# Patient Record
Sex: Male | Born: 1935 | Race: White | Hispanic: No | Marital: Married
Health system: Southern US, Community
[De-identification: ages and names within clinical notes are randomized; demographics above are authoritative.]

---

## 2003-03-13 ENCOUNTER — Other Ambulatory Visit: Payer: Self-pay

## 2004-03-20 ENCOUNTER — Other Ambulatory Visit: Payer: Self-pay

## 2004-03-20 ENCOUNTER — Observation Stay: Payer: Self-pay | Admitting: Cardiology

## 2004-03-25 ENCOUNTER — Ambulatory Visit: Payer: Self-pay | Admitting: *Deleted

## 2005-11-05 ENCOUNTER — Ambulatory Visit: Payer: Self-pay | Admitting: Gastroenterology

## 2009-10-03 ENCOUNTER — Ambulatory Visit: Payer: Self-pay | Admitting: Family Medicine

## 2010-08-08 ENCOUNTER — Ambulatory Visit: Payer: Self-pay | Admitting: Family Medicine

## 2010-09-03 ENCOUNTER — Ambulatory Visit: Payer: Self-pay | Admitting: Gastroenterology

## 2010-09-05 LAB — PATHOLOGY REPORT

## 2012-02-21 ENCOUNTER — Ambulatory Visit: Payer: Self-pay | Admitting: Hospice and Palliative Medicine

## 2012-02-28 ENCOUNTER — Inpatient Hospital Stay: Payer: Self-pay | Admitting: Student

## 2012-02-28 LAB — CBC
HCT: 32.2 % — ABNORMAL LOW (ref 40.0–52.0)
MCHC: 33.4 g/dL (ref 32.0–36.0)
Platelet: 238 10*3/uL (ref 150–440)
RBC: 3.58 10*6/uL — ABNORMAL LOW (ref 4.40–5.90)
RDW: 14 % (ref 11.5–14.5)
WBC: 15.7 10*3/uL — ABNORMAL HIGH (ref 3.8–10.6)

## 2012-02-28 LAB — COMPREHENSIVE METABOLIC PANEL
Alkaline Phosphatase: 94 U/L (ref 50–136)
BUN: 15 mg/dL (ref 7–18)
Calcium, Total: 8.1 mg/dL — ABNORMAL LOW (ref 8.5–10.1)
Co2: 29 mmol/L (ref 21–32)
Creatinine: 1.03 mg/dL (ref 0.60–1.30)
EGFR (Non-African Amer.): >60
SGPT (ALT): 18 U/L (ref 12–78)
Sodium: 140 mmol/L (ref 136–145)
Total Protein: 6.1 g/dL — ABNORMAL LOW (ref 6.4–8.2)

## 2012-02-28 LAB — CK TOTAL AND CKMB (NOT AT ARMC): CK-MB: 3.8 ng/mL — ABNORMAL HIGH (ref 0.5–3.6)

## 2012-02-28 LAB — URINALYSIS, COMPLETE
Ketone: NEGATIVE
Nitrite: NEGATIVE
Ph: 5 (ref 4.5–8.0)
Protein: NEGATIVE
RBC,UR: 2 /HPF (ref 0–5)

## 2012-02-28 LAB — TROPONIN I: Troponin-I: 0.1 ng/mL — ABNORMAL HIGH

## 2012-02-29 LAB — BASIC METABOLIC PANEL
Anion Gap: 7 (ref 7–16)
BUN: 13 mg/dL (ref 7–18)
Calcium, Total: 8.2 mg/dL — ABNORMAL LOW (ref 8.5–10.1)
Chloride: 106 mmol/L (ref 98–107)
Co2: 24 mmol/L (ref 21–32)
EGFR (Non-African Amer.): >60
Glucose: 101 mg/dL — ABNORMAL HIGH (ref 65–99)
Osmolality: 274 (ref 275–301)
Potassium: 3.5 mmol/L (ref 3.5–5.1)

## 2012-02-29 LAB — CBC WITH DIFFERENTIAL/PLATELET
Basophil #: 0.1 10*3/uL (ref 0.0–0.1)
Eosinophil %: 0.2 %
Lymphocyte #: 0.9 10*3/uL — ABNORMAL LOW (ref 1.0–3.6)
MCH: 31.5 pg (ref 26.0–34.0)
MCV: 90 fL (ref 80–100)
Monocyte #: 0.6 x10 3/mm (ref 0.2–1.0)
Monocyte %: 5.1 %
Neutrophil #: 11.1 10*3/uL — ABNORMAL HIGH (ref 1.4–6.5)
Neutrophil %: 87 %
Platelet: 218 10*3/uL (ref 150–440)
RDW: 14.1 % (ref 11.5–14.5)
WBC: 12.7 10*3/uL — ABNORMAL HIGH (ref 3.8–10.6)

## 2012-02-29 LAB — CK TOTAL AND CKMB (NOT AT ARMC): CK-MB: 3.8 ng/mL — ABNORMAL HIGH (ref 0.5–3.6)

## 2012-03-04 LAB — VANCOMYCIN, TROUGH: Vancomycin, Trough: 14 ug/mL (ref 10–20)

## 2012-03-05 LAB — CULTURE, BLOOD (SINGLE)

## 2012-03-07 LAB — CULTURE, BLOOD (SINGLE)

## 2012-03-10 ENCOUNTER — Inpatient Hospital Stay: Payer: Self-pay | Admitting: Internal Medicine

## 2012-03-10 LAB — URINALYSIS, COMPLETE
Blood: NEGATIVE
Glucose,UR: NEGATIVE mg/dL (ref 0–75)
Nitrite: NEGATIVE
Protein: NEGATIVE
RBC,UR: <1 /HPF (ref 0–5)
Specific Gravity: 1.016 (ref 1.003–1.030)
WBC UR: 1 /HPF (ref 0–5)

## 2012-03-10 LAB — BASIC METABOLIC PANEL
BUN: 21 mg/dL — ABNORMAL HIGH (ref 7–18)
Calcium, Total: 7.8 mg/dL — ABNORMAL LOW (ref 8.5–10.1)
EGFR (African American): >60
EGFR (Non-African Amer.): 59 — ABNORMAL LOW
Glucose: 132 mg/dL — ABNORMAL HIGH (ref 65–99)
Potassium: 3.6 mmol/L (ref 3.5–5.1)
Sodium: 144 mmol/L (ref 136–145)

## 2012-03-10 LAB — TROPONIN I: Troponin-I: 0.4 ng/mL — ABNORMAL HIGH

## 2012-03-10 LAB — CBC
HGB: 9.4 g/dL — ABNORMAL LOW (ref 13.0–18.0)
MCH: 31.3 pg (ref 26.0–34.0)
RBC: 2.99 10*6/uL — ABNORMAL LOW (ref 4.40–5.90)
WBC: 9.3 10*3/uL (ref 3.8–10.6)

## 2012-03-11 LAB — TROPONIN I
Troponin-I: 0.34 ng/mL — ABNORMAL HIGH
Troponin-I: 0.23 ng/mL — ABNORMAL HIGH

## 2012-03-11 LAB — BASIC METABOLIC PANEL
BUN: 20 mg/dL — ABNORMAL HIGH (ref 7–18)
Chloride: 112 mmol/L — ABNORMAL HIGH (ref 98–107)
EGFR (African American): >60
Glucose: 128 mg/dL — ABNORMAL HIGH (ref 65–99)
Osmolality: 293 (ref 275–301)
Potassium: 3.7 mmol/L (ref 3.5–5.1)

## 2012-03-11 LAB — CBC WITH DIFFERENTIAL/PLATELET
Basophil #: 0 10*3/uL (ref 0.0–0.1)
Eosinophil #: 0 10*3/uL (ref 0.0–0.7)
Eosinophil %: 0.2 %
HCT: 27.9 % — ABNORMAL LOW (ref 40.0–52.0)
HGB: 9.6 g/dL — ABNORMAL LOW (ref 13.0–18.0)
Lymphocyte %: 10.3 %
MCH: 32 pg (ref 26.0–34.0)
Monocyte #: 0.4 x10 3/mm (ref 0.2–1.0)
Monocyte %: 4.5 %
Neutrophil #: 7.6 10*3/uL — ABNORMAL HIGH (ref 1.4–6.5)
Platelet: 151 10*3/uL (ref 150–440)
RBC: 3 10*6/uL — ABNORMAL LOW (ref 4.40–5.90)
RDW: 16.4 % — ABNORMAL HIGH (ref 11.5–14.5)
WBC: 9.1 10*3/uL (ref 3.8–10.6)

## 2012-03-11 LAB — MAGNESIUM: Magnesium: 1.8 mg/dL

## 2012-03-13 LAB — COMPREHENSIVE METABOLIC PANEL
Alkaline Phosphatase: 71 U/L (ref 50–136)
Anion Gap: 5 — ABNORMAL LOW (ref 7–16)
BUN: 23 mg/dL — ABNORMAL HIGH (ref 7–18)
Bilirubin,Total: 0.7 mg/dL (ref 0.2–1.0)
Chloride: 110 mmol/L — ABNORMAL HIGH (ref 98–107)
EGFR (African American): >60
Glucose: 120 mg/dL — ABNORMAL HIGH (ref 65–99)
Osmolality: 301 (ref 275–301)
Potassium: 3 mmol/L — ABNORMAL LOW (ref 3.5–5.1)
SGOT(AST): 24 U/L (ref 15–37)
Sodium: 149 mmol/L — ABNORMAL HIGH (ref 136–145)
Total Protein: 5.5 g/dL — ABNORMAL LOW (ref 6.4–8.2)

## 2012-03-13 LAB — CBC WITH DIFFERENTIAL/PLATELET
Basophil #: 0 10*3/uL (ref 0.0–0.1)
Basophil %: 0.2 %
Eosinophil %: 0.9 %
HCT: 27.2 % — ABNORMAL LOW (ref 40.0–52.0)
HGB: 9.1 g/dL — ABNORMAL LOW (ref 13.0–18.0)
MCH: 30.6 pg (ref 26.0–34.0)
MCV: 92 fL (ref 80–100)
Monocyte #: 0.3 x10 3/mm (ref 0.2–1.0)
Monocyte %: 4.3 %
Neutrophil #: 6.4 10*3/uL (ref 1.4–6.5)
RBC: 2.96 10*6/uL — ABNORMAL LOW (ref 4.40–5.90)
WBC: 7.5 10*3/uL (ref 3.8–10.6)

## 2012-03-14 LAB — BASIC METABOLIC PANEL
Anion Gap: 8 (ref 7–16)
BUN: 26 mg/dL — ABNORMAL HIGH (ref 7–18)
Chloride: 111 mmol/L — ABNORMAL HIGH (ref 98–107)
Co2: 28 mmol/L (ref 21–32)
EGFR (African American): >60
Osmolality: 299 (ref 275–301)

## 2012-03-14 LAB — EXPECTORATED SPUTUM ASSESSMENT W GRAM STAIN, RFLX TO RESP C

## 2012-03-17 LAB — CBC WITH DIFFERENTIAL/PLATELET
Basophil %: 0.6 %
Eosinophil #: 0.2 10*3/uL (ref 0.0–0.7)
HCT: 32.5 % — ABNORMAL LOW (ref 40.0–52.0)
HGB: 10.4 g/dL — ABNORMAL LOW (ref 13.0–18.0)
Lymphocyte #: 0.9 10*3/uL — ABNORMAL LOW (ref 1.0–3.6)
MCH: 30.1 pg (ref 26.0–34.0)
MCHC: 32 g/dL (ref 32.0–36.0)
MCV: 94 fL (ref 80–100)
Monocyte #: 0.6 x10 3/mm (ref 0.2–1.0)
Neutrophil #: 12.8 10*3/uL — ABNORMAL HIGH (ref 1.4–6.5)
Neutrophil %: 88.3 %

## 2012-03-17 LAB — BASIC METABOLIC PANEL
Anion Gap: 7 (ref 7–16)
BUN: 27 mg/dL — ABNORMAL HIGH (ref 7–18)
Calcium, Total: 8.2 mg/dL — ABNORMAL LOW (ref 8.5–10.1)
Chloride: 117 mmol/L — ABNORMAL HIGH (ref 98–107)
Creatinine: 1.08 mg/dL (ref 0.60–1.30)
EGFR (African American): >60
EGFR (Non-African Amer.): 60 — ABNORMAL LOW
Glucose: 107 mg/dL — ABNORMAL HIGH (ref 65–99)

## 2012-03-18 LAB — CBC WITH DIFFERENTIAL/PLATELET
Basophil #: 0 10*3/uL (ref 0.0–0.1)
Eosinophil %: 0.2 %
HGB: 10.8 g/dL — ABNORMAL LOW (ref 13.0–18.0)
Lymphocyte %: 5.3 %
Monocyte %: 2.6 %
Neutrophil #: 11.6 10*3/uL — ABNORMAL HIGH (ref 1.4–6.5)
Platelet: 202 10*3/uL (ref 150–440)
RBC: 3.51 10*6/uL — ABNORMAL LOW (ref 4.40–5.90)
RDW: 16.8 % — ABNORMAL HIGH (ref 11.5–14.5)

## 2012-03-18 LAB — BASIC METABOLIC PANEL
BUN: 32 mg/dL — ABNORMAL HIGH (ref 7–18)
Chloride: 121 mmol/L — ABNORMAL HIGH (ref 98–107)
Creatinine: 0.91 mg/dL (ref 0.60–1.30)
EGFR (Non-African Amer.): >60
Potassium: 3.8 mmol/L (ref 3.5–5.1)
Sodium: 156 mmol/L — ABNORMAL HIGH (ref 136–145)

## 2012-03-23 ENCOUNTER — Ambulatory Visit: Payer: Self-pay | Admitting: Hospice and Palliative Medicine

## 2012-04-23 DEATH — deceased

## 2014-04-09 IMAGING — CT CT CERVICAL SPINE WITHOUT CONTRAST
1 series · 12 of 14 positions shown, 15 images · non-contrast
Comparison: none

REASON FOR EXAM: UNWITNESSED FALL, DEMENTIA, UNABLE TO EVALUATED
CLINICALLY
COMMENTS:

PROCEDURE:     CT  - CT CERVICAL SPINE WO  - February 28, 2012 [DATE]
RESULT:     Comparison: None.
TECHNIQUE: Multiple axial CT images were obtained of the cervical spine,
without intravenous contrast.  Sagittal and coronal reformatted images were
constructed.

[Series 6: axial · axial · 0.45mm/px · z∈[+36,+156]mm · 12 of 72 slices shown, 15 images]
[im 6/72  soft-tissue]
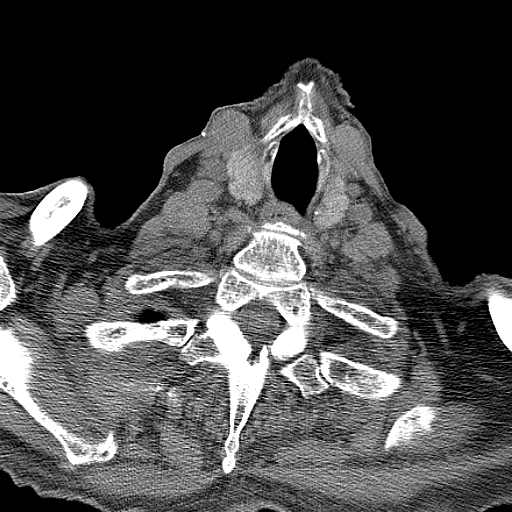
[im 6/72  bone]
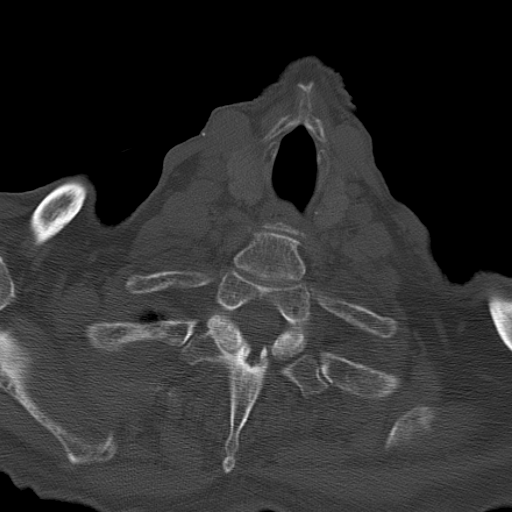
[im 11/72  bone]
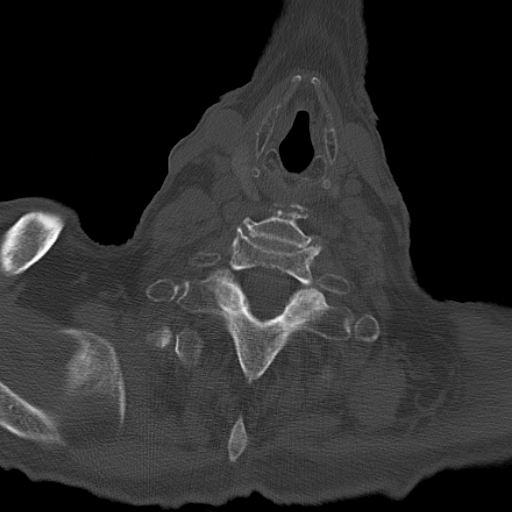
[im 17/72  bone]
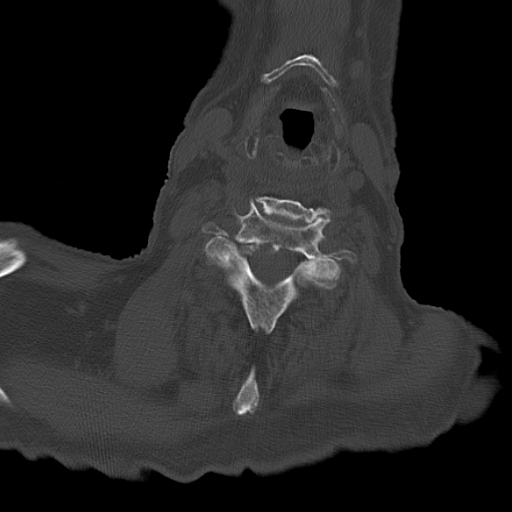
[im 22/72  bone]
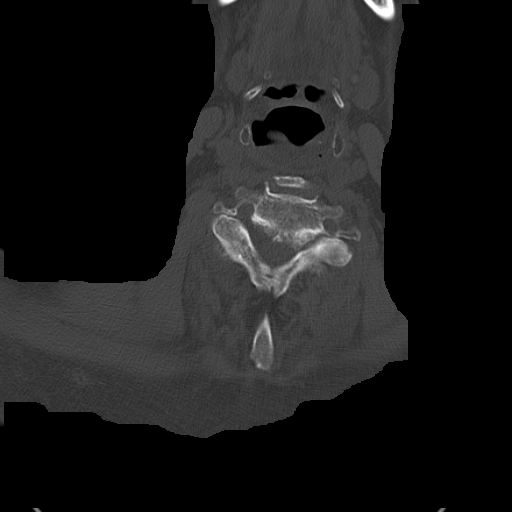
[im 28/72  soft-tissue]
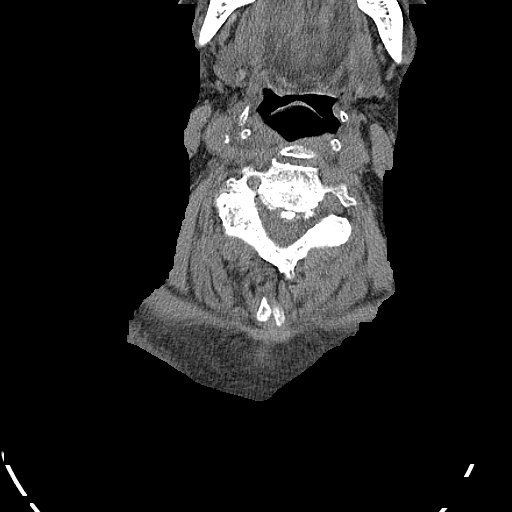
[im 28/72  bone]
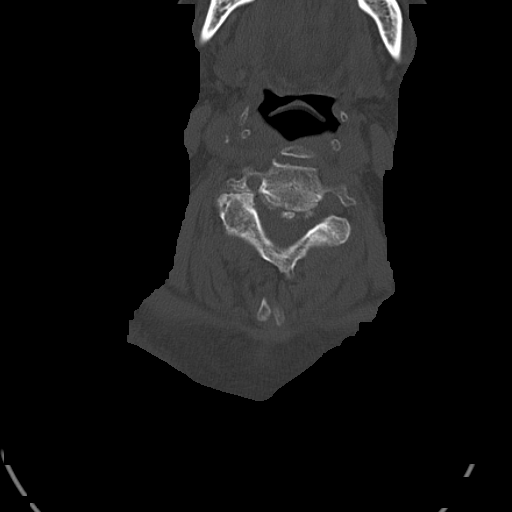
[im 33/72  bone]
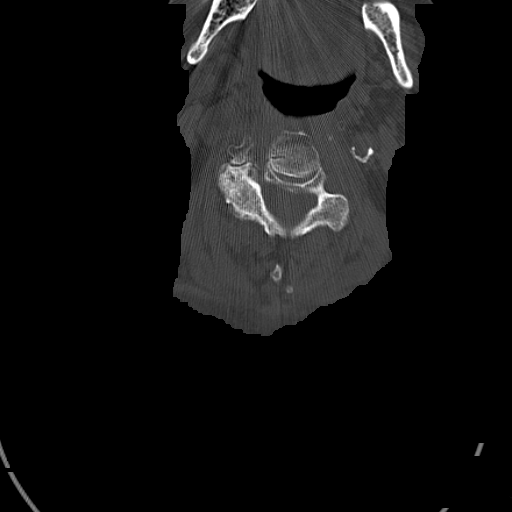
[im 39/72  bone]
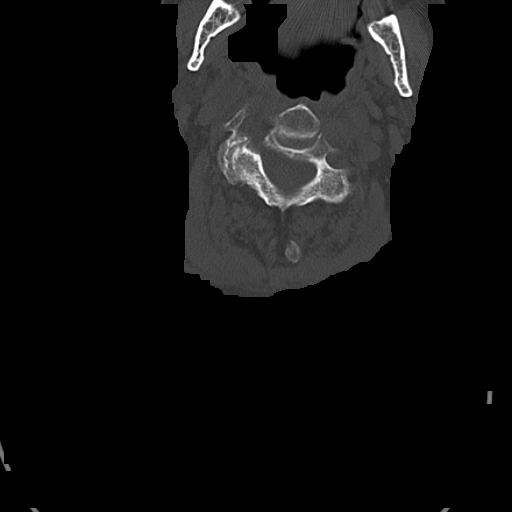
[im 44/72  bone]
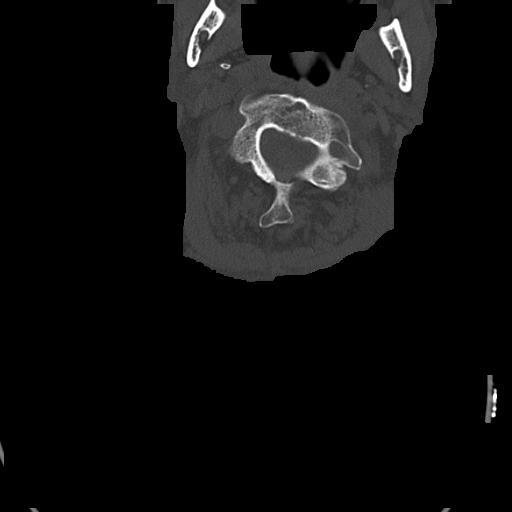
[im 50/72  soft-tissue]
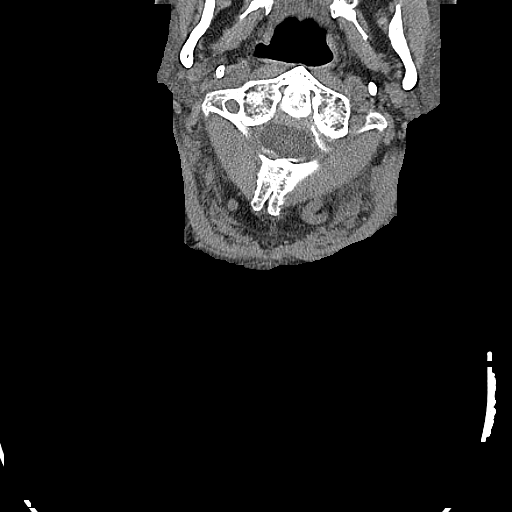
[im 50/72  bone]
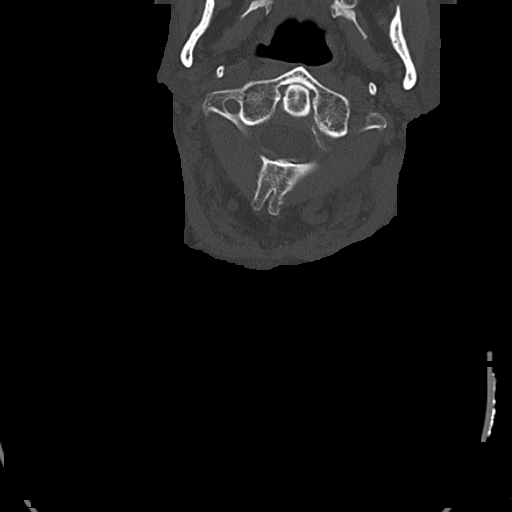
[im 55/72  bone]
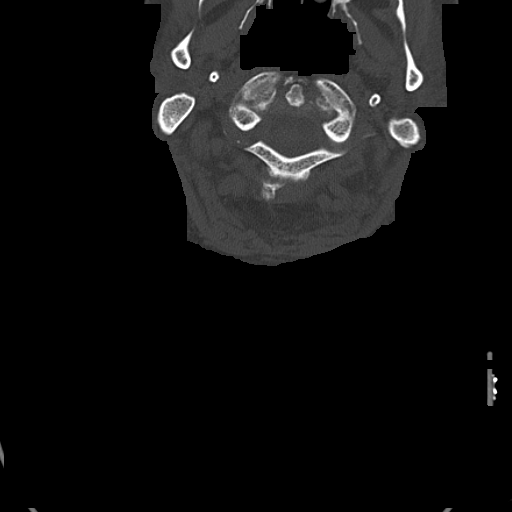
[im 61/72  bone]
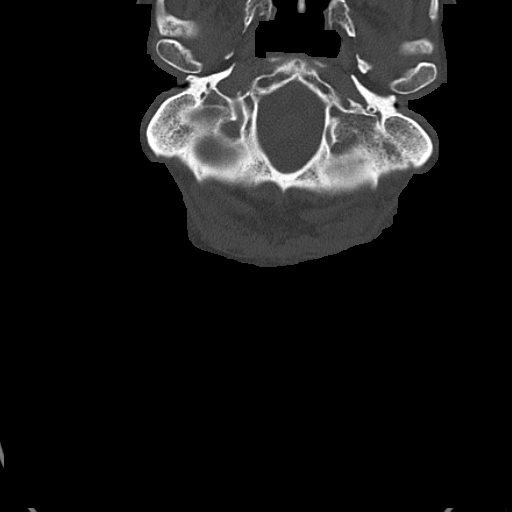
[im 66/72  bone]
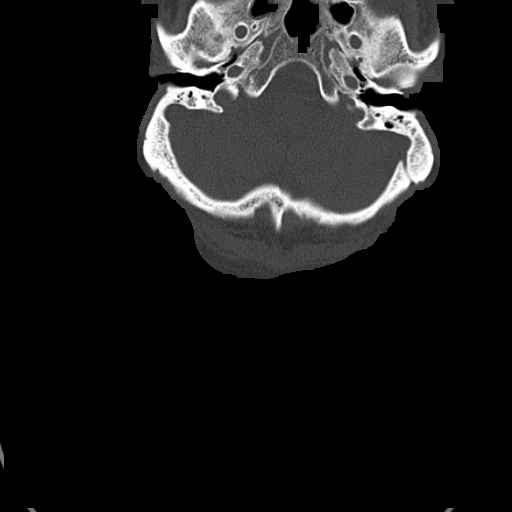

[12 of 14 positions shown; findings below may reference images not displayed]

FINDINGS: There is approximately 2-3 mm of anterolisthesis of C3 on C4 and C4 on C5.
There is 2 mm of retrolisthesis of C5 on C6 and C6 on C7. Vertebral body
heights are relatively preserved. There is straightening of the normal
cervical lordosis, which is nonspecific. No acute fracture seen. There is
multilevel degenerative disc disease.
IMPRESSION: 1. No cervical spine fracture seen.
2. There is multilevel mild anterolisthesis and retrolisthesis as described
above. This may be degenerative. Ligamentous injury is not excluded.

[REDACTED]

## 2014-04-09 IMAGING — CT CT ABD-PELV W/ CM
1 of 3 series · 13 of 32 positions shown, 18 images · IV contrast (isovue)
Comparison: none

REASON FOR EXAM: (1) ABD PAIN AND ELEVATED WBC; (2) ABD PAIN AND ELEVATED
WBC
COMMENTS:

PROCEDURE:     CT  - CT ABDOMEN / PELVIS  W  - February 28, 2012  [DATE]
RESULT:     Comparison:  None
TECHNIQUE: Multiple axial images of the abdomen and pelvis were performed
from the lung bases to the pubic symphysis, without p.o. contrast and with
100 mL of Isovue 300 intravenous contrast.

[Series 3: soft tissue · axial · 0.64mm/px · z∈[-1058,-674]mm · 13 of 144 slices shown, 18 images]
[im 8/144  soft-tissue]
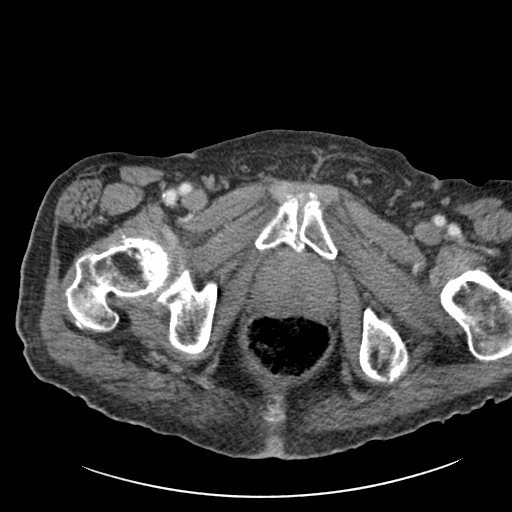
[im 8/144  bone]
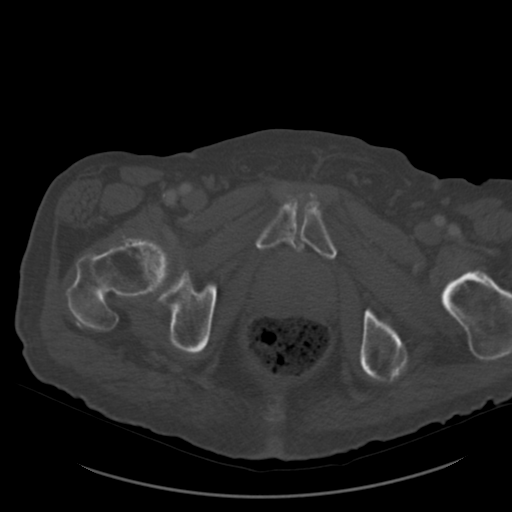
[im 22/144  soft-tissue]
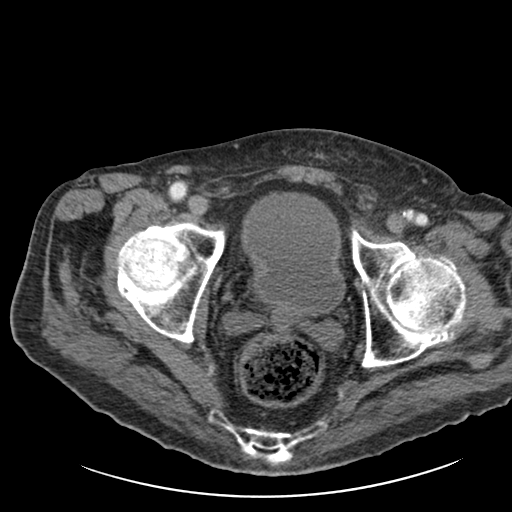
[im 29/144  soft-tissue]
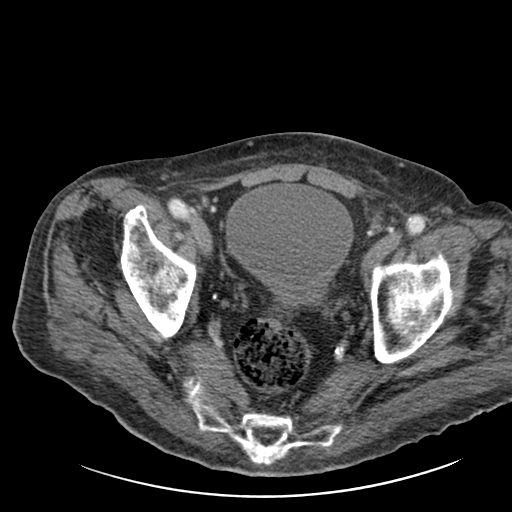
[im 43/144  soft-tissue]
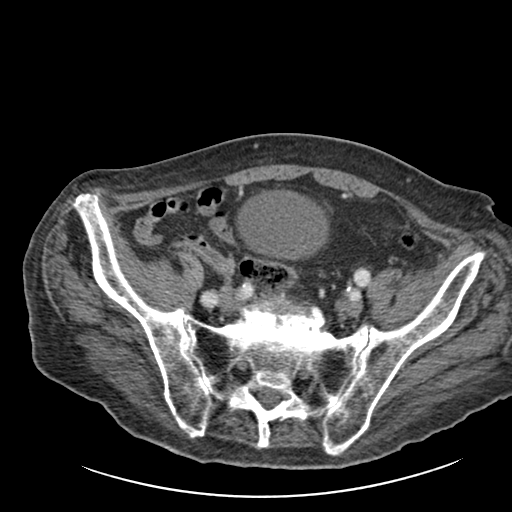
[im 58/144  soft-tissue]
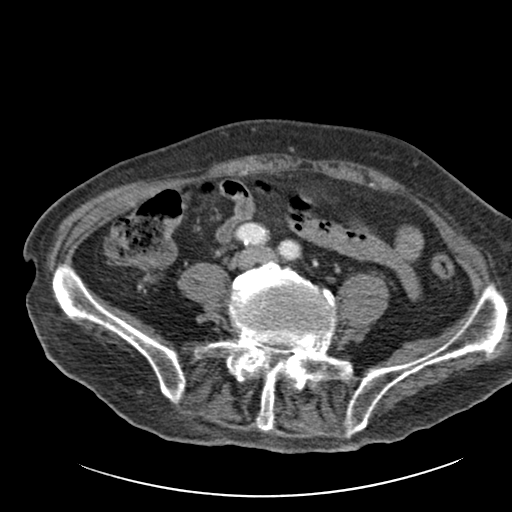
[im 65/144  soft-tissue]
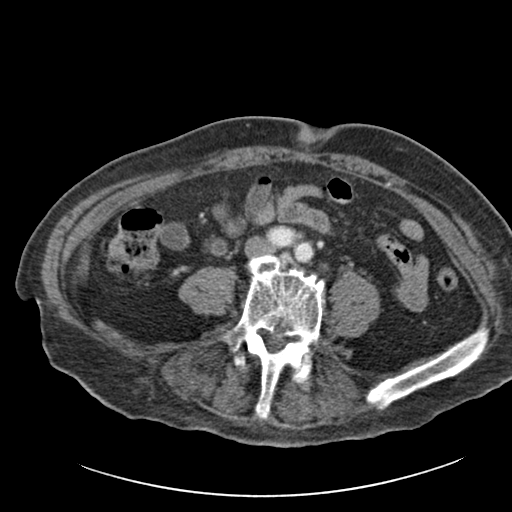
[im 79/144  soft-tissue]
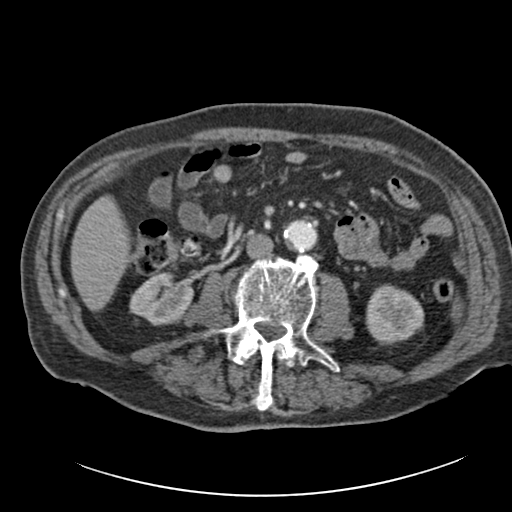
[im 86/144  soft-tissue]
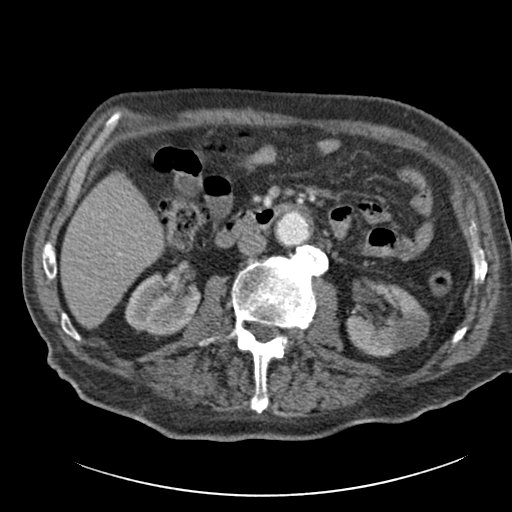
[im 101/144  soft-tissue]
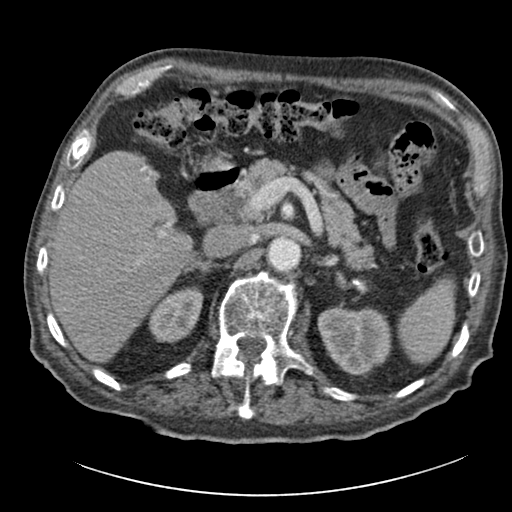
[im 101/144  bone]
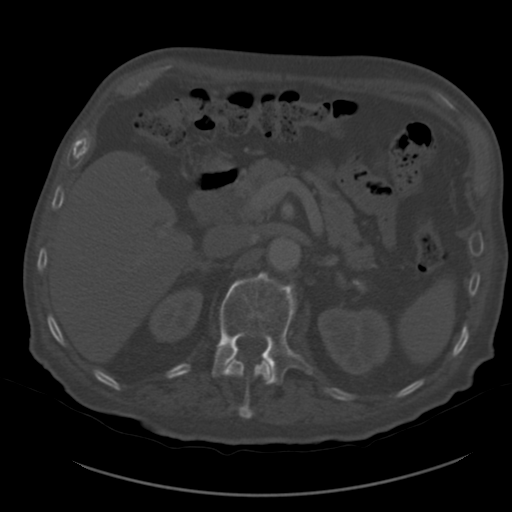
[im 115/144  soft-tissue]
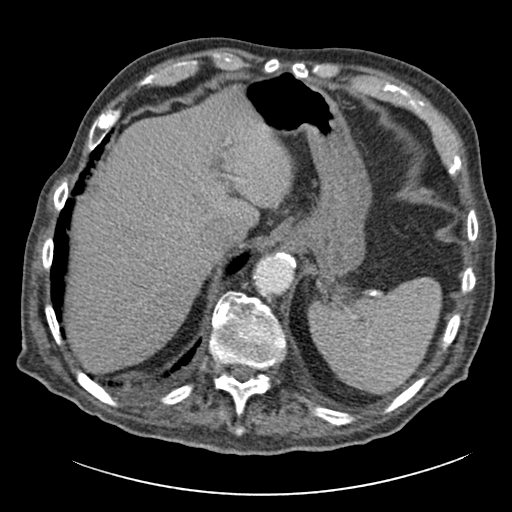
[im 115/144  lung]
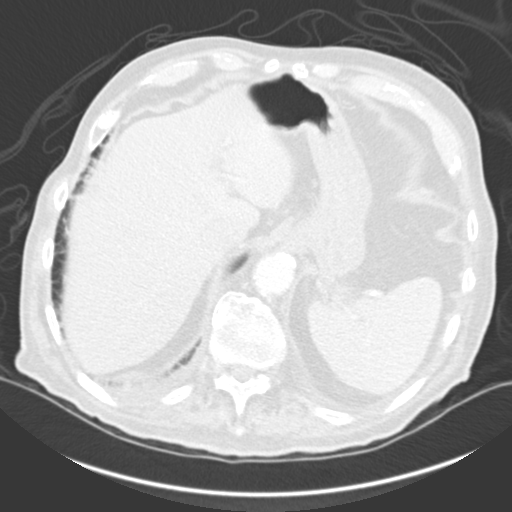
[im 122/144  soft-tissue]
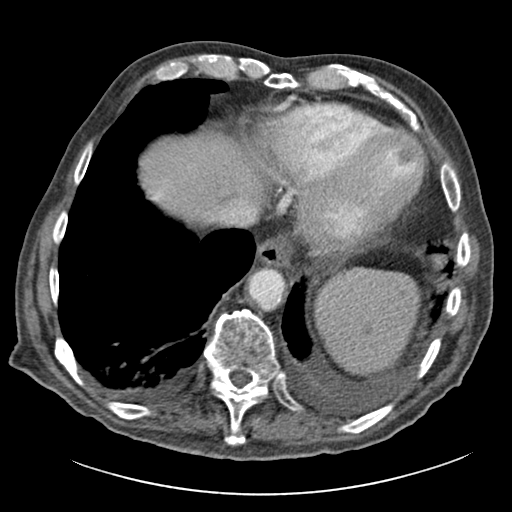
[im 122/144  lung]
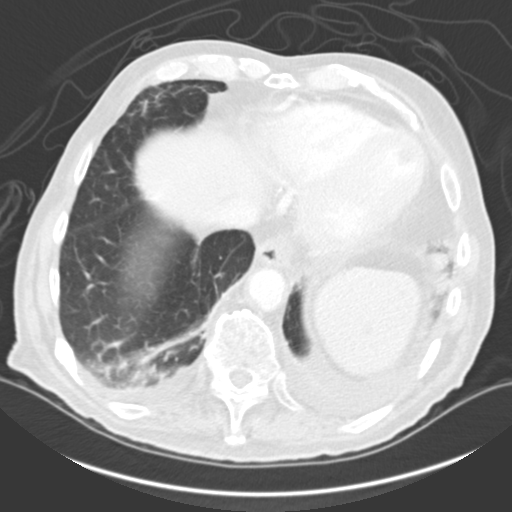
[im 129/144  lung]
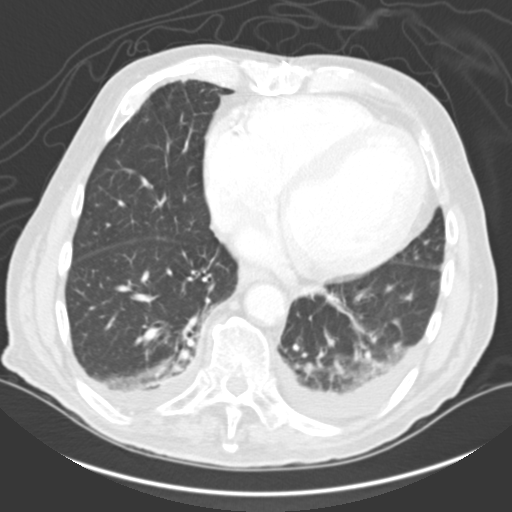
[im 136/144  soft-tissue]
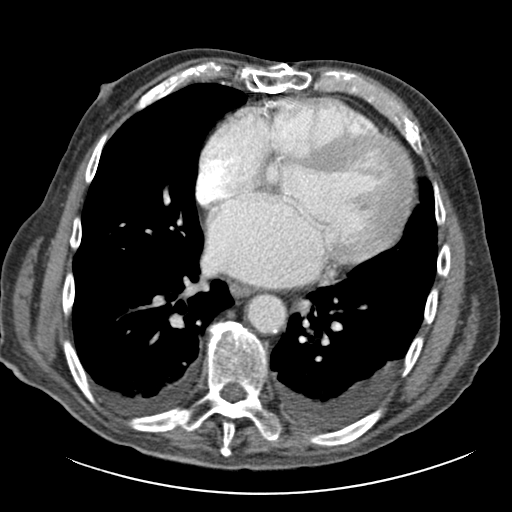
[im 136/144  lung]
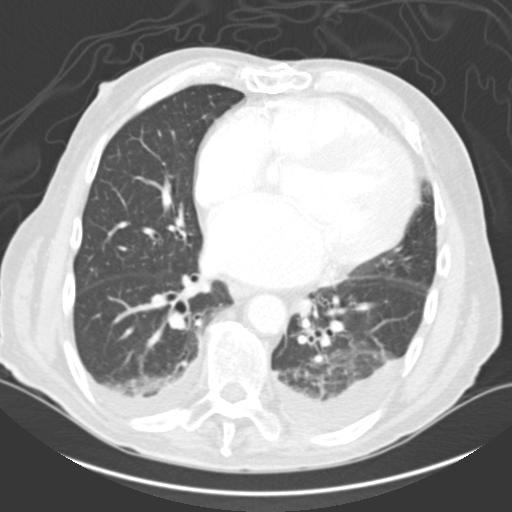

[13 of 32 positions shown; findings below may reference images not displayed]

FINDINGS: There are small bilateral pleural effusions. Calcifications are seen in the
coronary arteries. Mild prominence of the distal esophageal wall is likely
secondary to underdistention. There is an elongated 7 mm nodule in the right
middle lobe. Mild basilar opacities are likely secondary to atelectasis.
There is a 12 mm round low-attenuation focus in the apex of the left
ventricle. This could represent a focal area of thrombus.

A small low-attenuation focus in the left hepatic lobe is too small to
characterize. Surgical clips are seen from prior cholecystectomy. Small
low-attenuation foci in the spleen are too small to characterize. The
adrenals and pancreas are unremarkable. There is an ill-defined area of
low-attenuation and hypoenhancement in the left kidney. It measures
approximately 2.7 x 1.9 cm. The right kidney enhances normally.

The prostate is enlarged. The prostate is somewhat heterogeneous, which is
nonspecific. There is a small low-attenuation focus in the left side of the
prostate, as seen on image 131. There is mild stranding in the fat between
the left side of the bladder and the sigmoid colon. There are a few
diverticula in the sigmoid colon. The appendix is normal.

No aggressive lytic or sclerotic osseous lesions are identified.
IMPRESSION: 1. There is mild stranding in the fat between the left side of the bladder
and the sigmoid colon. This could be related to cystitis or mild
diverticulitis. Clinical correlation is suggested.
2. There is an ill-defined area of hypoattenuation and decreased enhancement
in the left kidney. This could represent pyelonephritis. Correlate with
urinalysis. Followup contrast-enhanced CT is recommended to ensure
resolution and exclude a renal mass.
3. Small round low-attenuation focus in the apex of the left ventricle could
represent thrombus within the ventricle. Further evaluation with
echocardiography is recommended.
4. There is an elongated 7 mm nodule in the right middle lobe which is
indeterminate. Followup noncontrast chest CT is recommended in 3 months.
5. The prostate is enlarged, and heterogeneous. This is nonspecific. There
is a small low-attenuation focus in left side of the prostate. Correlate
with PSA.

[REDACTED]

## 2014-07-10 NOTE — Consult Note (Signed)
Impression: 81yo male w/ h/o Alheimer's dementia admitted with witnessed aspiration and hypoxia.  Most aspiration causes a chemical pneumonitis rather than an infection.  It can cause fever, infiltrates and hypoxia.  His WBC is normal and he has had no fever since admission. Will stop his antibiotics.   Follow him clinically.  I discussed with his wife that recurrent aspiration is a risk with worsening dementia. Consideration of PEJ may be necessary if he has recurrent episodes with eating.  Electronic Signatures: Chelsae Zanella, Rosalyn GessMichael E (MD)  (Signed on 20-Dec-13 17:23)  Authored  Last Updated: 20-Dec-13 17:23 by Demetries Coia, Rosalyn GessMichael E (MD)

## 2014-07-10 NOTE — H&P (Signed)
PATIENT NAME:  Kenneth Shah, Kenneth Shah MR#:  [id number] DATE OF BIRTH:  [birth year]  DATE OF ADMISSION:  03/10/2012  REFERRING PHYSICIAN: Dr. Caleen Jobshristine Shah. Braud.  PRIMARY CARE PHYSICIAN: Dr. Quillian QuinceBliss.   CHIEF COMPLAINT: Cough with decrease of mental status, sputum and fever.   HISTORY OF PRESENT ILLNESS: The patient is a very nice 81 year old gentleman who has a significant past medical history of coronary artery disease, status post coronary artery bypass graft in 2005, Alzheimer's dementia, hypertension, BPH who has been recently hospitalized on 02/28/2012 and discharged on 03/04/2012 due to fever and what looked like a systemic inflammatory response syndrome. The patient was likely having pyelo and gram-positive cocci bacteremia due to gram-positive strep with Streptococcus mitis and Streptococcus oralis. Those are usual frequent bacteria in oral flora, but he was diagnosed with a UTI. The patient was discharged on antibiotics, and today, he comes back with changes in his mental status, being profusely weak, starting to cough last night and breathing really hard this morning. He had some sputum that he has not been able to cough up very well, but the wife says that she can hear it very well when he coughs. He had fever and no chills.   The patient is severely demented, and he could not do much information, but what the wife can tell me is that this morning at the nursing home he was not even able to get out of the chair and the nurses were not helping him for what she asked for help. After she called the supervisor, the supervisor decided he needed to go to the hospital.   Here at the ER, the patient examined. He looks dehydrated. He has significant cough, mild respiratory distress and his chest x-ray showed bilateral pulmonary infiltrates which are likely to be related with aspiration.   The wife tells me that within the past couple of days he has been choking with food and coughing very hard after he eats.    REVIEW OF SYSTEMS: Unable to obtain due to the patient's severe dementia.   PAST MEDICAL HISTORY:  1. Alzheimer's dementia.  2. Coronary artery disease, status post CABG in 2005.  3. Hypertension.  4. Hyperlipidemia.  5. Status post hospitalization with bacteremia due to gram-positive Streptococcus.   PAST SURGICAL HISTORY: Cholecystectomy and coronary artery bypass graft.   ALLERGIES: No known drug allergies.   SOCIAL HISTORY: The patient used to work at [employer] in [place], but he retired. He has never smoked. He does not drink. He was living at home with his wife up until his last discharge on December 13 when he was discharged to a nursing home.   FAMILY HISTORY: Mother had heart disease and died in her almost 90s. He is not able to give me the story. I recorded all of the story from the wife and previous records.   MEDICATIONS: Simvastatin 10 mg once a day, Seroquel 25 mg once a day, potassium chloride 20 mg once a day, multivitamins 1 once daily, metoprolol 25 mg twice a day, lisinopril 20 mg once a day, Flomax 0.4 mg once daily, aspirin 81 mg once a day.   PHYSICAL EXAMINATION:  VITAL SIGNS: Blood pressure 172/88, pulse 99 to 106, respirations about 24, temperature 100.6, oxygen saturation 84% on room air.  GENERAL: The patient is elderly, lying in bed, disheveled appearance. He looks acutely ill and toxic.  HEENT: Normocephalic, atraumatic. Pupils are equal and reactive. Extraocular movements are intact. Anicteric sclerae. Pale conjunctivae. Oropharynx is  clear without any lesions, but his mucosa is very dry, severely dry.  NECK: Supple. No JVD. No thyromegaly. No adenopathy. No carotid bruits. No rigidity.   CARDIOVASCULAR: His heart rate is slightly irregular due to occasional PACs. He has a 3/6 systolic ejection murmur. No displacement of PMI.  LUNGS: The patient has diffuse rales in both respiratory fields. No wheezing. There is no dullness to percussion.  There is positive use of accessory muscles.  ABDOMEN: Soft, nontender and nondistended. No hepatosplenomegaly. No masses. Bowel sounds are positive.  EXTREMITIES: Positive edema +1 or 2 which apparently has been a chronic finding. He has a wound on his left dorsum which is clear and no signs of infection.  SKIN: No rashes. No acne. No significant bruises.   LYMPHATIC: Negative for lymphadenopathy in neck or supraclavicular areas.  NEUROLOGIC: Cranial nerves II through XII seem to be intact. Strength is 4/5 in 4 extremities. No focal findings.  PSYCHIATRIC: The patient is lethargic, not able to answer questions but he opens his eyes spontaneously.  MUSCULOSKELETAL: No significant joint abnormality. No joint effusions.   LABORATORIES: Glucose 132, BUN 21, creatinine 1.9 which is elevation from baseline which is 0.8, sodium 144, potassium 3.6, chloride 112. Calcium 7.8. Troponin is 0.4, previous to that was 0.1. Hemoglobin is 9.4. White count is 9.3. Platelets 169. Blood cultures x2 taken. EKG: Normal sinus rhythm with occasional PACs.   Chest x-ray has bilateral infiltrates   ASSESSMENT AND PLAN: A 81 year old gentleman with history of coronary artery disease, dementia, hypertension, hyperlipidemia who comes with significant bilateral infiltrates likely due to pneumonia, possible aspiration.  1. Acute respiratory failure: The patient is not intubated. He is DO NOT RESUSCITATE but has significant respiratory distress and potential for decompensation at this moment, although this is very likely to be aspiration pneumonia. We are going to cover him as well for healthcare-acquired pneumonia. He is going to be on Zosyn, vancomycin and Levaquin. That will give [place] the coverage for anaerobes, atypicals and double coverage for Pseudomonas.  2. Systemic inflammatory response syndrome (SIRS): Evidenced by fever, tachycardia, increased respiratory rate, altered mental status. The patient has a normal white count,  but he has been on antibiotics to treat bacteremia. Continue intravenous fluids and broad-spectrum antibiotics. Cultures have been taken. A sputum culture is ordered. Urine culture ordered as well.  3. Pneumonia: Likely aspiration, but we are going to cover him for healthcare-acquired pneumonia as well. Nebulizers have been ordered. The patient looks very delicate. He looks toxic. He has potential for severe decompensation. If there is, we can use BiPAP. The patient does not want to be intubated, at least that is per wife's wishes.  4. Hypertension: The patient is going to be n.p.o. I am going to give him beta blocker and ACE inhibitor intravenous.  5. Dementia: P.r.n. medications for anxiety and agitation are going to be ordered.  6. Severe dehydration: We are going to put him on normal saline (NS). His sodium is actually 144, for what I am going to change it to half NS and monitor his sodium levels. Evidence of dehydration with elevation of BUN, elevation of his creatinine to _____ from baseline discharge of 0.8, elevation of chloride.  7. Anemia: The patient has anemia of chronic disease. Seems to be stable.  8. Other medical problems seem to be stable at this moment. Deep venous thrombosis prophylaxis with heparin. Gastrointestinal prophylaxis with proton pump inhibitor.   TIME SPENT: I spent about 15 minutes with this admission.  The patient is DO NOT RESUSCITATE.   ____________________________ Felipa Furnace, MD rsg:gb D: 03/10/2012 21:32:27 ET T: 03/10/2012 21:56:25 ET JOB#: [id number]  cc: Felipa Furnace, MD, <Dictator> Samy Ryner Juanda Chance MD ELECTRONICALLY SIGNED 03/22/2012 10:58

## 2014-07-10 NOTE — Consult Note (Signed)
PATIENT NAME:  Kenneth Shah, Kenneth Shah MR#:  [id number] DATE OF BIRTH:  05-09-1918  DATE OF CONSULTATION:  03/11/2012  REFERRING PHYSICIAN:  Hope Pigeon. Elisabeth Pigeon, MD CONSULTING PHYSICIAN:  Rosalyn Gess. Tashira Torre, MD  REASON FOR CONSULTATION: Aspiration pneumonia.   HISTORY OF PRESENT ILLNESS: The patient is a 81 year old man with a past history significant for Alzheimer dementia who was recently hospitalized in early December with pneumococcal bacteremia. He was discharged on December 13 on no antibiotics, and discharge summary indicates it was thought he had possible pyelonephritis. However, his blood cultures were positive for pneumococcus. A second set was also positive for a viridans strep species. The patient was noted by his wife to have a witnessed episode of aspiration 2 days prior to admission. He continued to have coughing over the next 24 hours and the next day was brought to the Emergency Room. The patient is unable to provide any history due to his current dementia. The history is obtained mainly from the wife. He had been having some increased confusion over the last day. He had not had significant episodes of aspiration that she had witnessed in the past. She does not know of any fevers, chills or sweats that he had been having, although he did have a temperature of 100.6 in the Emergency Room. He has been afebrile since being in the hospital. On admission, he was started on vancomycin, Zosyn and Levaquin. His white count has been normal. His wife states that he has been having more weakness in the last few days as well.   ALLERGIES: None.   PAST MEDICAL HISTORY:  1.  Alzheimer dementia.  2.  Coronary artery disease, status post CABG.  3.  Hypertension.  4.  Hypercholesterolemia.  5.  Recent pneumococcal bacteremia.  6.  Status post cholecystectomy.   SOCIAL HISTORY: The patient lives in a nursing home. He is a nonsmoker. He does not drink. His wife has a Development worker, international aid.   FAMILY HISTORY:  Positive for coronary artery disease.   REVIEW OF SYSTEMS: Unable to obtain from the patient due to his current dementia.   PHYSICAL EXAMINATION:  VITAL SIGNS: T-max 98.1, T-current 98.1, pulse 97, blood pressure 102/54, 100% on room air.  GENERAL: A 81 year old white man in no acute distress.  HEENT: Normocephalic, atraumatic. Pupils equal and reactive to light. Extraocular motion intact. Sclerae, conjunctivae and lids without evidence for emboli or petechiae. Oropharynx shows no erythema or exudate. Gums are in fair condition.  NECK: Midline trachea. No lymphadenopathy. No thyromegaly.  CHEST: Some crackles bilaterally. No focal consolidation. Good air movement.  CARDIAC: Regular rate and rhythm without murmur, rub or gallop.  ABDOMEN: Soft, nontender and nondistended. No hepatosplenomegaly. No hernias noted.  EXTREMITIES: No evidence for tenosynovitis.  SKIN: No rashes. No stigmata of endocarditis, specifically no Janeway lesions or Osler nodes.  NEUROLOGIC: The patient was confused. He was unable to provide any history. He did not respond verbally but was able to follow simple commands.  PSYCHIATRIC: Unable to assess due to his current dementia.   LABORATORY DATA: BUN 20, creatinine 0.98, bicarbonate 28, anion gap of 5. White count 9.1, hemoglobin 9.6, platelet count 151, ANC of 7.6. White count on admission was 9.3. White count at discharge from his last hospitalization was 12.7. Blood cultures show no growth to date. Urinalysis is unremarkable. Urine culture shows no growth to date. A chest x-ray from admission shows bilateral pneumonia versus bilateral pulmonary edema. Repeat chest x-ray today shows consistent with worsening alveolar filling  process, consistent with aspiration pneumonia versus congestive heart failure.   IMPRESSION: A 81 year old man with a history of Alzheimer dementia admitted with witnessed aspiration and hypoxia.   RECOMMENDATIONS:  1.  Most aspiration causes a  chemical pneumonitis rather than infection. It can cause fever, infiltrates and hypoxia. His white count is normal and he has had no fever since admission.  2.  Will stop his infiltrates.  3.  Follow him clinically. I discussed with his wife that recurrent aspiration is a risk with worsening dementia.  4.  Consider PEJ tube, which may be necessary if he has recurrent episodes with eating.   This is a moderately complex infectious disease case. Thank you very much for involving me in this patient's care.     ____________________________ Rosalyn GessMichael E. Stanislaus Kaltenbach, MD meb:jm D: 03/11/2012 17:36:03 ET T: 03/12/2012 14:23:03 ET JOB#: [id number]  cc: Rosalyn GessMichael E. Tarhonda Hollenberg, MD, <Dictator> Tonisha Silvey E Ellah Otte MD ELECTRONICALLY SIGNED 03/14/2012 12:18

## 2014-07-10 NOTE — Discharge Summary (Signed)
PATIENT NAME:  Kenneth Shah, Kenneth Shah MR#:  [id number] DATE OF BIRTH:  03/31/1918  DATE OF ADMISSION:  02/28/2012 DATE OF DISCHARGE:  03/04/2012   PRIMARY CARE PHYSICIAN: Dr. Quillian QuinceBliss.   CHIEF COMPLAINT: Altered mental status and fall.   DISCHARGE DIAGNOSES:  1. Systemic inflammatory response syndrome with early pyelonephritis and gram-positive strep bacteremia.  2. Weakness.  3. Advanced dementia with behavioral issues.  4. Hypertension.  5. Positive troponin, likely demand ischemia with cardiomyopathy of about 35%.  6. Hyperlipidemia.  7. Coronary artery disease status post bypass.   DISCHARGE MEDICATIONS:  1. Vantin 200 mg q.12 hours for five days.  2. Flomax 0.4 mg daily.  3. Lisinopril 20 mg daily. 4. Seroquel 25 mg at bedtime.  5. Aspirin 81 mg daily.  6. Once a day vitamin 1 tab daily.  7. Simvastatin 10 mg daily.  8. Metoprolol tartrate 25 mg 2 times a day.   DIET: Low sodium, mechanical soft.   ACTIVITY: As tolerated.   DISPOSITION: The patient will be going to rehab.   FOLLOWUP: Please follow with primary care physician within 1 to 2 weeks.   CODE STATUS: DO NOT RESUSCITATE.   HISTORY OF PRESENT ILLNESS: For full details of history and physical, please see the dictation on 02/28/2012 by Dr. Nemiah CommanderKalisetti, but briefly this is a 81 year old Caucasian male with a history of coronary artery disease status post bypass, Alzheimer's dementia, benign prostatic hypertrophy, who was brought in for increased confusion from baseline and multiple falls, generalized weakness. He was noted to have some leukocytosis and tachycardia on arrival and CT showed possible early pyelonephritis and possible cystitis and was admitted to the hospitalist service for further evaluation and management.   SIGNIFICANT LABS AND IMAGING: Initial creatinine 1.03, BUN 15, potassium 3.4. WBC on arrival was 15.7, last WBC of 12.7. Initial hemoglobin 10.7. Initial troponin 0.13, then 0.10, then 0.10. Urine cultures:  Rare gram-positive. Blood cultures two out of two bottles on arrival showing strep Mitis/oralis which is viridans. Repeat blood cultures from 12/10, no growth to date. The Strep bacteremia is susceptible to ceftriaxone and Levaquin. Echocardiogram showing ejection fraction of about 35% with some regional wall motion abnormalities, no thrombus. CT of abdomen and pelvis on arrival showing mild stranding. The fat between the left side of the bladder and sigmoid colon could be cystitis or mild diverticulitis. Ill-defined area of attenuation in the left kidney could represent pyelonephritis. Elongated 7 mm nodule in the right middle lobe which is indeterminate. Prostate is enlarged CT of head without contrast showed no acute intracranial findings. Small chronic vessel disease. CT of spine: No cervical spine fracture.   HOSPITAL COURSE: The patient was admitted to the hospitalist service and started on broad-spectrum antibiotics for the cystitis and early pyelonephritis. Blood cultures and urine cultures were sent. Blood cultures did grow strep and vancomycin was added. For his weakness, physical therapy was obtained. The patient was started on some IV fluids and the patient was seen by PT. The patient slowly did improve. Blood cultures on arrival were positive as above. The patient did have SIRS criteria on admission, the likely sources being the urine versus early pyelonephritis as noted on the CT scan. Repeat blood cultures from 12/10 have been no growth to date. The patient will be discharged on five days of Vantin. He has no fever and his leukocytosis has trended down. CT of the head was negative for acute stroke, but the weakness likely is from the setting of progression of his  debilitation, weakness, in addition to SIRS with sepsis. The patient did have sepsis per criteria given the SIRS and infection as above. He did have a positive troponin on arrival and they were trended which did trend down. There are no  significant EKG changes and the patient had an echocardiogram which did show ejection fraction of 35%. The patient likely has some cardiomyopathy, which is ischemic with his history of coronary artery bypass graft and coronary artery disease. He will be discharged on aspirin statin, and a beta blocker. He was previously on beta blocker, but had some brady episodes. However, here has had none. His blood pressure is stable. His dementia is stable, but he does have some aggressive mood at times and p.r.n. Risperdal could be tried. At this point he is stable to be discharged.   CODE STATUS: The patient is DO NOT RESUSCITATE.   TOTAL TIME SPENT: 35 minutes.   ____________________________ [name], MD sa:ap D: 03/04/2012 13:20:12 ET T: 03/04/2012 13:37:57 ET JOB#: [id number]  cc: [name], MD, <Dictator> Burley Saver, MD [name] MD ELECTRONICALLY SIGNED 03/22/2012 14:08

## 2014-07-10 NOTE — H&P (Signed)
PATIENT NAME:  Kenneth Shah, Kenneth Shah MR#:  [id number] DATE OF BIRTH:  April 03, 1918  DATE OF ADMISSION:  02/28/2012  ADMITTING PHYSICIAN: Dr. Enid Baas  PRIMARY CARE PHYSICIAN: Dr. Clayborn Bigness    CHIEF COMPLAINT: Altered mental status and fall.   HISTORY OF PRESENT ILLNESS: Kenneth Shah is a 81 year old elderly Caucasian male with past medical history significant for coronary artery disease status post bypass graft surgery 2005, hypertension, Alzheimer's dementia and benign prostatic hypertrophy was brought in from home by EMS as he had a fall from the bed today and was altered and covered in stool. Due to his dementia patient is unable to provide any history and most of the history is obtained from his wife at bedside. According to his wife patient has had worsening of his dementia with weakness going on for the past month but since the past week he has had three falls. He is supposed to walk with a walker and usually tends to forget using walker and has been falling. Also generalized weakness especially of his lower extremities noted that he cannot even sit down in a place as he falls to the floor. He did not want to eat his food last night which is unusual for him, usually she feeds him and this morning he fell from the bed and also had large bowel movement and covered in stool so called EMS. She denies any fevers or chills or cough or congestion lately. Patient does not usually complain of any pain. He does have chronic restlessness of his legs and gets bruised easily when you touch him. In the ED he had an elevated white count of 15,000, found to be tachycardic in 103 heart rate and CT of the abdomen showing possible diverticulitis/cystitis versus left-sided early pyelonephritis.   PAST MEDICAL HISTORY:  1. Dementia.  2. Hypertension.  3. Hyperlipidemia.  4. Coronary artery disease status post bypass graft surgery.   PAST SURGICAL HISTORY: Cholecystectomy.   ALLERGIES TO MEDICATIONS: No  known drug allergies.    MEDICATIONS AT HOME:  1. Flomax 0.4 mg p.o. daily.  2. Lisinopril 20 mg p.o. daily.  3. Aspirin 81 mg p.o. daily.  4. Multivitamin.  5. Seroquel 25 mg as needed for aggressive behavior 2 to 3 times per day.   SOCIAL HISTORY: Patient worked at [employer] in [place] for several years and is currently retired at this time. No history of any smoking or alcohol use. He lives at home with his wife and also has a daughter who lives in [place].   FAMILY HISTORY: According to patient's wife patient's mom had heart disease, died in late 31s or early 84s.   REVIEW OF SYSTEMS: Difficult to be obtained secondary to patient's dementia.   PHYSICAL EXAMINATION: VITAL SIGNS: Temperature 99.1 degrees Fahrenheit, pulse 103, respirations 20, blood pressure 158/81, pulse oximetry 100% on room air.   GENERAL: Elderly male lying in bed very disheveled appearance, not in any acute distress.   HEENT: Normocephalic, atraumatic. Pupils equal, round, reacting to light. Anicteric sclerae. Extraocular movements intact. Oropharynx clear without erythema, mass or exudates.   NECK: Supple. No thyromegaly, JVD, or carotid bruits. No lymphadenopathy.   LUNGS: Moving air bilaterally. No wheeze or crackles. No use of accessory muscles for breathing.   CARDIOVASCULAR: S1 and S2, irregular, rate is normal. Loud 3/6 systolic murmur with opening snap heard in the mitral area.    ABDOMEN: Soft, nontender, nondistended. No hepatosplenomegaly. Normal bowel sounds.   EXTREMITIES: No pedal edema.  2+ dorsalis pedis pulses palpable bilaterally. The left leg appears more atrophied than the right leg.   SKIN: No acne, rash, or lesions other than old bruises.  LYMPHATICS: No cervical lymphadenopathy.   NEUROLOGIC: Cranial nerves intact. He does follow simple commands and able to move all four extremities. 4/5 strength at this time.   PSYCH: Patient is awake and alert but oriented  only to person.   LABORATORY, DIAGNOSTIC AND RADIOLOGIC DATA: WBC 15.7, hemoglobin 10.7, hematocrit 32.2, platelet count 238.   Sodium 140, potassium 3.4, chloride 105, bicarbonate 29, BUN 15, creatinine 1.03, glucose 106, calcium 8.1.   ALT 18, AST 34, alkaline phosphatase 94, total bilirubin 1.2, albumin 2.5. Troponin 0.13. Urinalysis with 1+ blood and 1+ bacteria and few WBCs. No leukocyte esterase or nitrite. CT of the head without contrast showing no acute intracranial findings and chronic small vessel ischemic disease. CT of the C-spine without contrast showing no cervical spine fracture, multilevel mild anterolisthesis and retrolisthesis as described above maybe degenerative.   CT of the abdomen and pelvis with contrast showing mild stranding of the fat between left side of bladder and colon could be cystitis or mild diverticulitis. Ill-defined area of hyperkinesia and decreased enhancement in left kidney could represent pyelonephritis. Contrast-enhanced follow-up CT recommended to ensure resolution and exclude a renal mass. Small round attenuation of focus in apex of left ventricle could represent thrombus within the ventricle. Further evaluation with echocardiography is recommended. 7 mm nodule in right middle lobe which is indeterminate. Prostate is enlarged and heterogenous.   ASSESSMENT AND PLAN: 81 year old male with history of hypertension, dementia, coronary artery disease status post CABG brought in after a fall and generalized weakness. 1. Systemic inflammatory response syndrome with elevated WBC and tachycardia likely from cystitis noted on the CT abdomen though urinalysis is not impressive. Will send for blood and urine cultures. Since patient is symptomatic will start on empiric Rocephin. Also questionable pyelonephritis on the left side versus small renal mass is suspected. At this time continue antibiotics and repeat the urinalysis in 1 to 2 days. He also does have hematuria so renal  mass cannot be completely excluded at this time. Because of patient's overall poor clinical condition he is also a DO NOT RESUSCITATE status. Will need to have to discuss wife how aggressive she wants to be if it was found to be a renal mass.  2. Generalized weakness with multiple falls-three falls in the past week with worsening dementia, possible underlying infection could be adding to that. Gentle IV fluids. Will get physical therapy evaluation and likely rehab placement. He did have a CT of the head which showed chronic small vessel ischemic changes but no acute infarct.  3. Coronary artery disease status post bypass graft surgery, appears stable on aspirin. His troponin is elevated, likely demand ischemia rather than acute MI. Continue to trend troponins. Also follow up echo because CT revealed a small LV thrombus.  4. For possible paroxysmal atrial fibrillation with sick sinus syndrome, he does have significant periods of bradycardia from his old notes and at his PCPs office per wife so not on any beta blocker so will not add any rate controlling agents at this time. Continue to monitor. He is on aspirin and he is not a candidate for long-term anticoagulation anyway due to his multiple falls and also severe dementia.  5. Hypertension. He is on lisinopril. Again no beta blockers due to history of bradycardic episodes several times.  6. Dementia with aggressive mood  at time. Continue Seroquel p.r.n.  7. CODE STATUS: DO NOT RESUSCITATE as discussed with wife at bedside.   TIME SPENT ON ADMISSION: 50 minutes.  ____________________________ Enid Baasadhika Yong Wahlquist, MD rk:cms D: 02/28/2012 16:29:59 ET T: 02/29/2012 05:25:40 ET JOB#: [id number]  cc: Enid Baasadhika Kenyata Napier, MD, <Dictator> Burley SaverL. Katherine Bliss, MD Enid BaasADHIKA Kasim Mccorkle MD ELECTRONICALLY SIGNED 03/08/2012 15:00

## 2014-07-13 NOTE — Discharge Summary (Signed)
DATE OF BIRTH:  September 09, 1918  DISCHARGE DIAGNOSES:  Pneumonia, possibly aspiration, severe acute respiratory syndrome, hypokalemia, dehydration, elevated cardiac enzymes, systolic heart failure.   HISTORY OF PRESENTING ILLNESS:  A 81 year old male with multiple past medical history, recent hospitalization with UTI and Gram-positive septicemia, and after treatment, was sent to nursing home, brought back to ER because of worsening mental status. On arrival to ER, was found having pneumonia ,altered mental status. He also had dysphagia, and so it was assumed that he might have aspirated. Initially, he was started for broad-spectrum antibiotic for healthcare-associated pneumonia, but later on infectious disease consult was done, and they suggested to discontinue all antibiotics because it was most probably aspiration pneumonia. He remained afebrile, but his overall condition did not improve. Palliative care consult was done, and family agreed on comfort care. So, he was managed on comfort care in hospital, and finally there was bed available at hospice home, so he was transferred for further management at hospice home.   OTHER MEDICAL ISSUES:  1.  SARS. As mentioned above, presented on admission with elevated white cell count, tachycardia, but resolved later on.  2.  Dementia. This was a chronic and stable issue.  3.  Hypokalemia. Supplemented and replaced.  4.  Dehydration. He was hydrated with IV fluid.  5.  Elevated troponin, likely demand ischemia. There were no ST-T changes. He also had ejection fraction 35% systolic heart failure, and so that might be contributing to mild high troponin.    CODE STATUS ON DISCHARGE:  DO NOT RESUSCITATE.   DIET:  As tolerated.   ACTIVITY:  As tolerated.   MEDICATION:  May crush or use liquid when appropriate.   OXYGEN ON DISCHARGE:  As needed for shortness of breath 1 liter per minute.  PLAN:  We left Foley catheter in for urinary  incontinence and convenience  issue. Roxanol for pain and dyspnea, for agitation and anxiety, Zantac (ranitidine) and suppositories for stool softener. He was transferred to hospice service.   TOTAL TIME SPENT ON DISCHARGE:  35 minutes.    ____________________________ Hope PigeonVaibhavkumar G. Elisabeth PigeonVachhani, MD vgv:ms D: 2012/03/25 16:39:12 ET T: 2012/03/25 18:45:19 ET JOB#: [id number]  cc: Hope PigeonVaibhavkumar G. Elisabeth PigeonVachhani, MD, <Dictator> Altamese DillingVAIBHAVKUMAR Jordell Outten MD ELECTRONICALLY SIGNED 04/18/2012 8:26
